# Patient Record
Sex: Female | Born: 1990 | Race: Black or African American | Hispanic: No | Marital: Single | Smoking: Former smoker
Health system: Southern US, Community
[De-identification: ages and names within clinical notes are randomized; demographics above are authoritative.]

---

## 2019-05-01 ENCOUNTER — Encounter (HOSPITAL_COMMUNITY): Payer: Self-pay | Admitting: Emergency Medicine

## 2019-05-01 ENCOUNTER — Emergency Department (HOSPITAL_COMMUNITY): Payer: Self-pay

## 2019-05-01 ENCOUNTER — Other Ambulatory Visit: Payer: Self-pay

## 2019-05-01 ENCOUNTER — Emergency Department (HOSPITAL_COMMUNITY)
Admission: EM | Admit: 2019-05-01 | Discharge: 2019-05-01 | Disposition: A | Discharge status: Home / Self Care | Payer: Self-pay | Attending: Emergency Medicine | Admitting: Emergency Medicine

## 2019-05-01 DIAGNOSIS — M940 Chondrocostal junction syndrome [Tietze]: Secondary | ICD-10-CM | POA: Insufficient documentation

## 2019-05-01 DIAGNOSIS — F17210 Nicotine dependence, cigarettes, uncomplicated: Secondary | ICD-10-CM | POA: Insufficient documentation

## 2019-05-01 LAB — BASIC METABOLIC PANEL
Anion gap: 8 (ref 5–15)
BUN: 19 mg/dL (ref 6–20)
CO2: 23 mmol/L (ref 22–32)
Calcium: 8.9 mg/dL (ref 8.9–10.3)
Chloride: 105 mmol/L (ref 98–111)
Creatinine, Ser: 0.97 mg/dL (ref 0.44–1.00)
GFR calc Af Amer: >60 mL/min (ref >60)
GFR calc non Af Amer: >60 mL/min (ref >60)
Glucose, Bld: 89 mg/dL (ref 70–99)
Potassium: 4.7 mmol/L (ref 3.5–5.1)
Sodium: 136 mmol/L (ref 135–145)

## 2019-05-01 LAB — CBC
HCT: 40 % (ref 36.0–46.0)
Hemoglobin: 13.3 g/dL (ref 12.0–15.0)
MCH: 32.4 pg (ref 26.0–34.0)
MCHC: 33.3 g/dL (ref 30.0–36.0)
MCV: 97.3 fL (ref 80.0–100.0)
Platelets: 210 10*3/uL (ref 150–400)
RBC: 4.11 MIL/uL (ref 3.87–5.11)
RDW: 11.7 % (ref 11.5–15.5)
WBC: 4.4 10*3/uL (ref 4.0–10.5)
nRBC: 0 % (ref 0.0–0.2)

## 2019-05-01 LAB — I-STAT BETA HCG BLOOD, ED (MC, WL, AP ONLY): I-stat hCG, quantitative: <5 m[IU]/mL (ref <5)

## 2019-05-01 LAB — TROPONIN I (HIGH SENSITIVITY)
Troponin I (High Sensitivity): <2 ng/L (ref <18)
Troponin I (High Sensitivity): 2 ng/L (ref <18)

## 2019-05-01 MED ORDER — KETOROLAC TROMETHAMINE 60 MG/2ML IM SOLN
30.0000 mg | Freq: Once | INTRAMUSCULAR | Status: AC
  Administered 2019-05-01: 30 mg via INTRAMUSCULAR
  Filled 2019-05-01: qty 2

## 2019-05-01 MED ORDER — SODIUM CHLORIDE 0.9% FLUSH: 3.0000 mL | Freq: Once | INTRAVENOUS | Status: DC

## 2019-05-01 MED ORDER — IBUPROFEN 800 MG PO TABS: 800.0000 mg | ORAL_TABLET | Freq: Three times a day (TID) | ORAL | 0 refills | Status: DC

## 2019-05-01 MED ORDER — IBUPROFEN 800 MG PO TABS: 800.0000 mg | ORAL_TABLET | Freq: Once | ORAL | Status: DC

## 2019-05-01 NOTE — ED Triage Notes (Signed)
Pt states she started having cp about an hour ago. Denies n/v//d. Pt states the pain went into her left arm and it felt a little tingly. CP worsens with deep inspiration.

## 2019-05-01 NOTE — Discharge Instructions (Addendum)
Take Motrin 3 times a day as prescribed.  You should take this with food, discontinue Motrin if you develop abdominal pain.  Recheck with your primary care provider next week, return to ER for new or worsening symptoms.

## 2019-05-01 NOTE — ED Provider Notes (Signed)
[facility] Provider [facility]   CSN: [id number] Arrival date & time: 05/01/19  1511     History   Chief Complaint Chief Complaint  Patient presents with  . Chest Pain    HPI Wendy Olsen is a 28 y.o. female.     27yo female presents with complaint of pain in her left chest, onset today while driving home from work at [employer]. Pain is intermittent, worse with movement, palpation, taking a deep breath. Patient has not taken anything for her pain. Denies fevers, chills, nausea, vomiting, sweats, injuries. Reports getting over a respiratory cold recently. No other complaints or concerns.      History reviewed. No pertinent past medical history.  There are no active problems to display for this patient.   History reviewed. No pertinent surgical history.   OB History   No obstetric history on file.      Home Medications    Prior to Admission medications   Medication Sig Start Date End Date Taking? Authorizing Provider  ibuprofen (ADVIL) 800 MG tablet Take 1 tablet (800 mg total) by mouth 3 (three) times daily. 05/01/19   Jeannie FendMurphy, Ramia Sidney A, PA-C    Family History History reviewed. No pertinent family history.  Social History Social History   Tobacco Use  . Smoking status: Current Every Day Smoker    Types: Cigarettes  . Smokeless tobacco: Never Used  Substance Use Topics  . Alcohol use: Yes  . Drug use: Not on file     Allergies   Patient has no known allergies.   Review of Systems Review of Systems  Constitutional: Negative for chills, diaphoresis and fever.  Respiratory: Negative for shortness of breath.   Cardiovascular: Positive for chest pain.  Gastrointestinal: Negative for abdominal pain, nausea and vomiting.  Musculoskeletal: Negative for arthralgias and myalgias.  Skin: Negative for rash and wound.  Allergic/Immunologic: Negative for immunocompromised state.  Neurological: Negative for weakness.   Psychiatric/Behavioral: Negative for confusion.  All other systems reviewed and are negative.    Physical Exam Updated Vital Signs BP 125/71 (BP Location: Right Arm)   Pulse 69   Temp 98.6 F (37 C) (Oral)   Resp 16   Ht 5\' 6"  (1.676 m)   Wt 72.6 kg   LMP 04/15/2019   SpO2 100%   BMI 25.82 kg/m   Physical Exam Vitals signs and nursing note reviewed.  Constitutional:      General: She is not in acute distress.    Appearance: She is well-developed. She is not diaphoretic.  HENT:     Head: Normocephalic and atraumatic.  Cardiovascular:     Rate and Rhythm: Normal rate and regular rhythm.     Heart sounds: Normal heart sounds. No murmur.  Pulmonary:     Effort: Pulmonary effort is normal.     Breath sounds: Normal breath sounds. No decreased breath sounds.  Chest:     Chest wall: Tenderness present.    Skin:    General: Skin is warm and dry.  Neurological:     Mental Status: She is alert and oriented to person, place, and time.  Psychiatric:        Behavior: Behavior normal.      ED Treatments / Results  Labs (all labs ordered are listed, but only abnormal results are displayed) Labs Reviewed  BASIC METABOLIC PANEL  CBC  I-STAT BETA HCG BLOOD, ED ([facility], [facility], [facility] ONLY)  TROPONIN I (HIGH SENSITIVITY)  TROPONIN I (HIGH SENSITIVITY)  EKG None  Radiology Dg Chest 2 View  Result Date: 05/01/2019 CLINICAL DATA:  Chest pain. EXAM: CHEST - 2 VIEW COMPARISON:  None. FINDINGS: The heart, hila, and mediastinum are normal. No pneumothorax. No nodules or masses. No focal infiltrates. IMPRESSION: No active cardiopulmonary disease. Electronically Signed   By: Dorise Bullion III M.D   On: 05/01/2019 16:12    Procedures Procedures (including critical care time)  Medications Ordered in ED Medications  sodium chloride flush (NS) 0.9 % injection 3 mL (has no administration in time range)  ketorolac (TORADOL) injection 30 mg (30 mg Intramuscular Given 05/01/19 2002)      Initial Impression / Assessment and Plan / ED Course  I have reviewed the triage vital signs and the nursing notes.  Pertinent labs & imaging results that were available during my care of the patient were reviewed by me and considered in my medical decision making (see chart for details).  Clinical Course as of Apr 30 2058  Thu Sep 17, 39102  [id number] 28 year old female presents with complaint of chest pain onset around 2:00 today when she was driving home from work.  Pain is worse with palpation, movement, taking a deep breath.  Patient is PERC negative.  On exam has tenderness along her lower sternum and left lower ribs anteriorly.  Palpation really reproduces patient's pain.  With history of recent URI suspect costochondritis.  Patient recommended to take Motrin 3 times daily x7 days.  Patient given injection of Toradol while in the ER.  EKG shows no acute ischemic changes, lab work reassuring including negative troponin, normal BMP, CMP, negative hCG.  Patient to follow-up with PCP if symptoms persist, return to ER for new or worsening symptoms.   [LM]    Clinical Course User Index [LM] Tacy Learn, PA-C      Final Clinical Impressions(s) / ED Diagnoses   Final diagnoses:  Costochondritis    ED Discharge Orders         Ordered    ibuprofen (ADVIL) 800 MG tablet  3 times daily     05/01/19 1904           Roque Lias 05/01/19 2059    Maudie Flakes, MD 05/03/19 [phone]

## 2019-05-01 NOTE — ED Notes (Signed)
Patient Alert and oriented to baseline. Stable and ambulatory to baseline. Patient verbalized understanding of the discharge instructions.  Patient belongings were taken by the patient.   

## 2019-08-13 ENCOUNTER — Other Ambulatory Visit: Payer: Self-pay

## 2019-08-13 ENCOUNTER — Encounter (HOSPITAL_COMMUNITY): Payer: Self-pay | Admitting: Emergency Medicine

## 2019-08-13 ENCOUNTER — Emergency Department (HOSPITAL_COMMUNITY)
Admission: EM | Admit: 2019-08-13 | Discharge: 2019-08-14 | Disposition: A | Discharge status: Home / Self Care | Payer: Self-pay | Attending: Emergency Medicine | Admitting: Emergency Medicine

## 2019-08-13 DIAGNOSIS — R109 Unspecified abdominal pain: Secondary | ICD-10-CM | POA: Insufficient documentation

## 2019-08-13 DIAGNOSIS — K625 Hemorrhage of anus and rectum: Secondary | ICD-10-CM | POA: Insufficient documentation

## 2019-08-13 DIAGNOSIS — R110 Nausea: Secondary | ICD-10-CM | POA: Insufficient documentation

## 2019-08-13 DIAGNOSIS — H1131 Conjunctival hemorrhage, right eye: Secondary | ICD-10-CM | POA: Insufficient documentation

## 2019-08-13 DIAGNOSIS — F17210 Nicotine dependence, cigarettes, uncomplicated: Secondary | ICD-10-CM | POA: Insufficient documentation

## 2019-08-13 LAB — URINALYSIS, ROUTINE W REFLEX MICROSCOPIC
Bilirubin Urine: NEGATIVE
Glucose, UA: NEGATIVE mg/dL
Hgb urine dipstick: NEGATIVE
Ketones, ur: NEGATIVE mg/dL
Leukocytes,Ua: NEGATIVE
Nitrite: NEGATIVE
Protein, ur: NEGATIVE mg/dL
Specific Gravity, Urine: 1.015 (ref 1.005–1.030)
pH: 7 (ref 5.0–8.0)

## 2019-08-13 LAB — COMPREHENSIVE METABOLIC PANEL
ALT: 14 U/L (ref 0–44)
AST: 20 U/L (ref 15–41)
Albumin: 3.7 g/dL (ref 3.5–5.0)
Alkaline Phosphatase: 50 U/L (ref 38–126)
Anion gap: 7 (ref 5–15)
BUN: 9 mg/dL (ref 6–20)
CO2: 25 mmol/L (ref 22–32)
Calcium: 8.9 mg/dL (ref 8.9–10.3)
Chloride: 107 mmol/L (ref 98–111)
Creatinine, Ser: 1.13 mg/dL — ABNORMAL HIGH (ref 0.44–1.00)
GFR calc Af Amer: >60 mL/min (ref >60)
GFR calc non Af Amer: >60 mL/min (ref >60)
Glucose, Bld: 86 mg/dL (ref 70–99)
Potassium: 4.1 mmol/L (ref 3.5–5.1)
Sodium: 139 mmol/L (ref 135–145)
Total Bilirubin: 0.3 mg/dL (ref 0.3–1.2)
Total Protein: 6.1 g/dL — ABNORMAL LOW (ref 6.5–8.1)

## 2019-08-13 LAB — CBC
HCT: 40.1 % (ref 36.0–46.0)
Hemoglobin: 12.7 g/dL (ref 12.0–15.0)
MCH: 30.8 pg (ref 26.0–34.0)
MCHC: 31.7 g/dL (ref 30.0–36.0)
MCV: 97.3 fL (ref 80.0–100.0)
Platelets: 241 10*3/uL (ref 150–400)
RBC: 4.12 MIL/uL (ref 3.87–5.11)
RDW: 11.9 % (ref 11.5–15.5)
WBC: 3.7 10*3/uL — ABNORMAL LOW (ref 4.0–10.5)
nRBC: 0 % (ref 0.0–0.2)

## 2019-08-13 LAB — I-STAT BETA HCG BLOOD, ED (MC, WL, AP ONLY): I-stat hCG, quantitative: <5 m[IU]/mL (ref <5)

## 2019-08-13 LAB — LIPASE, BLOOD: Lipase: 21 U/L (ref 11–51)

## 2019-08-13 MED ORDER — SODIUM CHLORIDE 0.9% FLUSH: 3.0000 mL | Freq: Once | INTRAVENOUS | Status: DC

## 2019-08-13 NOTE — ED Triage Notes (Signed)
Patient reports RLQ pain with bloody stool x1 today , mild nausea , no emesis or diarrhea , denies fever or chills .

## 2019-08-14 LAB — POC OCCULT BLOOD, ED: Fecal Occult Bld: NEGATIVE

## 2019-08-14 MED ORDER — IBUPROFEN 400 MG PO TABS
600.0000 mg | ORAL_TABLET | Freq: Once | ORAL | Status: AC
  Administered 2019-08-14: 600 mg via ORAL
  Filled 2019-08-14: qty 1

## 2019-08-14 NOTE — ED Provider Notes (Signed)
[facility]   CSN: [id number] Arrival date & time: 08/13/19  2138     History Chief Complaint  Patient presents with  . Abdominal Pain    Blood In Stool    Wendy Olsen is a 28 y.o. female with no significant past medical history who presents to the emergency department with a chief complaint of rectal bleeding.  The patient reports that she has been having some intermittent, diffuse abdominal cramping, onset today.  She reports that she has felt multiple times that she may need to have a bowel movement, but did not pass any stool.  She reports that earlier today she was finally able to have a bowel movement and noted bright red blood on the outside of the stool and lining the toilet bowl.  No history of similar.  Denies melena.  Reports that she will typically have a bowel movement almost every day or every other day.  She has no history of chronic constipation and has not had any straining to have a bowel movement.  She denies fever, chills, nausea, vomiting, rectal pain, dysuria, hematuria, vaginal pain, bleeding, or discharge, fatigue, shortness of breath, dizziness, or lightheadedness.  She reports that her menstrual cycle ended several days ago.  She works at [employer].  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  She also notes that she developed a red area on her right eye yesterday.  She cannot recall any specific instances where she had to lift or strain to pick up a package over the last few days at work.  She reports that the area has grown significantly smaller since onset.  No eye pain or visual changes.  She denies any other spontaneous bleeding.  No family history of bleeding disorders.  She does not take any blood thinners.  No treatment for symptoms prior to arrival.  No history of abdominal surgery.  She is a current, every day smoker.  She reports social alcohol use and denies other  illicit or recreational drug use.  No known history of hemorrhoids.  The history is provided by the patient. No language interpreter was used.       History reviewed. No pertinent past medical history.  There are no problems to display for this patient.   History reviewed. No pertinent surgical history.   OB History   No obstetric history on file.     No family history on file.  Social History   Tobacco Use  . Smoking status: Current Every Day Smoker    Types: Cigarettes  . Smokeless tobacco: Never Used  Substance Use Topics  . Alcohol use: Yes  . Drug use: Not on file    Home Medications Prior to Admission medications   Medication Sig Start Date End Date Taking? Authorizing Provider  ibuprofen (ADVIL) 800 MG tablet Take 1 tablet (800 mg total) by mouth 3 (three) times daily. 05/01/19   Jeannie FendMurphy, Laura A, PA-C    Allergies    Patient has no known allergies.  Review of Systems   Review of Systems  Constitutional: Negative for activity change, chills and fever.  HENT: Negative for congestion and sore throat.   Respiratory: Negative for shortness of breath.   Cardiovascular: Negative for chest pain.  Gastrointestinal: Positive for abdominal pain. Negative for anal bleeding, constipation, diarrhea, nausea, rectal pain and vomiting.  Genitourinary: Negative for dysuria, flank pain, frequency, pelvic pain, vaginal bleeding, vaginal discharge and vaginal pain.  Musculoskeletal: Negative for back pain, neck pain and neck stiffness.  Skin: Negative for rash.  Allergic/Immunologic: Negative for immunocompromised state.  Neurological: Negative for dizziness, seizures, syncope, weakness and headaches.  Psychiatric/Behavioral: Negative for confusion.    Physical Exam Updated Vital Signs BP 133/79 (BP Location: Left Arm)   Pulse (!) 58   Temp 98.2 F (36.8 C) (Oral)   Resp 16   SpO2 100%   Physical Exam Vitals and nursing note reviewed. Exam conducted with a chaperone  present.  Constitutional:      General: She is not in acute distress.    Appearance: She is normal weight. She is not ill-appearing, toxic-appearing or diaphoretic.     Comments: Well-appearing.  No acute distress.  HENT:     Head: Normocephalic.  Eyes:     Conjunctiva/sclera: Conjunctivae normal.     Comments: Subconjunctival hemorrhage to the right eye at approximately 9 o'clock.   Cardiovascular:     Rate and Rhythm: Normal rate and regular rhythm.     Heart sounds: No murmur. No friction rub. No gallop.   Pulmonary:     Effort: Pulmonary effort is normal. No respiratory distress.     Breath sounds: No stridor. No wheezing, rhonchi or rales.  Chest:     Chest wall: No tenderness.  Abdominal:     General: There is no distension.     Palpations: Abdomen is soft. There is no mass.     Tenderness: There is no abdominal tenderness. There is no right CVA tenderness, left CVA tenderness, guarding or rebound.     Hernia: No hernia is present.     Comments: Abdomen is soft, nontender, nondistended.  Normoactive bowel sounds.   Genitourinary:    Rectum: Normal. Guaiac result negative.     Comments: Chaperoned exam.  Rectal tone is normal.  No external hemorrhoids, fissures, or tears are noted.  Soft brown stool is noted on digital rectal exam.  No active bleeding. Musculoskeletal:     Cervical back: Neck supple.  Skin:    General: Skin is warm.     Findings: No rash.     Comments: No rashes, including petechiae or purpura.  No obvious bruising.  Neurological:     Mental Status: She is alert.  Psychiatric:        Behavior: Behavior normal.     ED Results / Procedures / Treatments   Labs (all labs ordered are listed, but only abnormal results are displayed) Labs Reviewed  COMPREHENSIVE METABOLIC PANEL - Abnormal; Notable for the following components:      Result Value   Creatinine, Ser 1.13 (*)    Total Protein 6.1 (*)    All other components within normal limits  CBC -  Abnormal; Notable for the following components:   WBC 3.7 (*)    All other components within normal limits  URINALYSIS, ROUTINE W REFLEX MICROSCOPIC - Abnormal; Notable for the following components:   APPearance HAZY (*)    All other components within normal limits  LIPASE, BLOOD  I-STAT BETA HCG BLOOD, ED ([facility], [facility], [facility] ONLY)  POC OCCULT BLOOD, ED    EKG None  Radiology No results found.  Procedures Procedures (including critical care time)  Medications Ordered in ED Medications  sodium chloride flush (NS) 0.9 % injection 3 mL (has no administration in time range)  ibuprofen (ADVIL) tablet 600 mg (600 mg Oral Given 08/14/19 4970)    ED Course  I have reviewed the triage vital signs  and the nursing notes.  Pertinent labs & imaging results that were available during my care of the patient were reviewed by me and considered in my medical decision making (see chart for details).    MDM Rules/Calculators/A&P                      28 year old female with no significant past medical history presenting with 1 episode of bright red blood per rectum earlier today.  She has also had some intermittent abdominal cramping.  No constitutional symptoms.  Vital signs are normal in the ER.  She is borderline bradycardic, but I suspect this is her baseline as she is very fit and active.  On exam, she does have a subconjunctival hemorrhage to the right eye.  She has had no other episodes of spontaneous bleeding aside from the one episode of rectal bleeding earlier today.  Labs are overall reassuring.  Hemoglobin is normal at 12.7, stable from September.  Urinalysis is unremarkable.  Rectal exam was reassuring and Hemoccult was negative.  She was given ibuprofen for abdominal cramping with improvement in her symptoms.  I suspect that given her job that she may have strained or sustained a small tear given that she also has a subconjunctival hemorrhage on the right.  No family history of  coagulopathies.  Overall, she is very well-appearing.  She reports that her medical insurance starts on January 1 and she is planning to get established with a primary care provider.  I have a low suspicion for hemorrhagic colitis, GI bleed at this time.  She is hemodynamically stable while no acute distress.  Safe for discharge home with outpatient follow-up as needed.  Final Clinical Impression(s) / ED Diagnoses Final diagnoses:  Rectal bleeding  Abdominal cramping  Subconjunctival hemorrhage of right eye    Rx / DC Orders ED Discharge Orders    None       Barkley Boards, PA-C 08/14/19 1914    Shon Baton, MD 08/14/19 [id number]

## 2019-08-14 NOTE — ED Notes (Signed)
C/o abd. Cramping tonight states she went to the bathroom and she noticed bright red blood after having a bowel movement then she got a little nauseated.

## 2019-08-14 NOTE — Discharge Instructions (Signed)
Thank you for allowing me to care for you today in the Emergency Department.   You were seen today for blood in your stool and abdominal cramping.  Your stool was tested today in the ER and tested negative for blood.  Your labs were all normal and reassuring.  You were also found to have a small subconjunctival hemorrhage, which is a medical term meaning a broken blood vessel, in your right eye.  The broken blood vessel in your eyes should heal on its own in the next few days.  Since your medical insurance starts on January 1, you can use the number on your paperwork to get established with a primary care provider for 2021.  If you have intermittent episodes of bright red blood in your stool you can follow-up with primary care.  Take 650 mg of Tylenol or 600 mg of ibuprofen with food every 6 hours for abdominal cramping.  You can alternate between these 2 medications every 3 hours if your pain returns.  For instance, you can take Tylenol at noon, followed by a dose of ibuprofen at 3, followed by second dose of Tylenol and 6.  You should return to the ER if you develop frequent episodes of black or bloody stools or vomiting, particularly if they are accompanied by fever, severe abdominal pain, shortness of breath, feeling dizzy or lightheaded, or if you pass out.

## 2020-04-27 ENCOUNTER — Encounter (HOSPITAL_COMMUNITY): Payer: Self-pay | Admitting: *Deleted

## 2020-04-27 ENCOUNTER — Emergency Department (HOSPITAL_COMMUNITY)
Admission: EM | Admit: 2020-04-27 | Discharge: 2020-04-27 | Disposition: A | Discharge status: Home / Self Care | Payer: Managed Care, Other (non HMO) | Attending: Emergency Medicine | Admitting: Emergency Medicine

## 2020-04-27 ENCOUNTER — Other Ambulatory Visit: Payer: Self-pay

## 2020-04-27 DIAGNOSIS — U071 COVID-19: Secondary | ICD-10-CM | POA: Insufficient documentation

## 2020-04-27 DIAGNOSIS — F17210 Nicotine dependence, cigarettes, uncomplicated: Secondary | ICD-10-CM | POA: Diagnosis not present

## 2020-04-27 DIAGNOSIS — R509 Fever, unspecified: Secondary | ICD-10-CM | POA: Diagnosis present

## 2020-04-27 DIAGNOSIS — E860 Dehydration: Secondary | ICD-10-CM

## 2020-04-27 DIAGNOSIS — R112 Nausea with vomiting, unspecified: Secondary | ICD-10-CM | POA: Diagnosis not present

## 2020-04-27 LAB — HEPATIC FUNCTION PANEL
ALT: 17 U/L (ref 0–44)
AST: 24 U/L (ref 15–41)
Albumin: 3.7 g/dL (ref 3.5–5.0)
Alkaline Phosphatase: 49 U/L (ref 38–126)
Bilirubin, Direct: 0.1 mg/dL (ref 0.0–0.2)
Indirect Bilirubin: 0.4 mg/dL (ref 0.3–0.9)
Total Bilirubin: 0.5 mg/dL (ref 0.3–1.2)
Total Protein: 6.6 g/dL (ref 6.5–8.1)

## 2020-04-27 LAB — CBC
HCT: 43.7 % (ref 36.0–46.0)
Hemoglobin: 13.7 g/dL (ref 12.0–15.0)
MCH: 30.2 pg (ref 26.0–34.0)
MCHC: 31.4 g/dL (ref 30.0–36.0)
MCV: 96.5 fL (ref 80.0–100.0)
Platelets: 152 10*3/uL (ref 150–400)
RBC: 4.53 MIL/uL (ref 3.87–5.11)
RDW: 12 % (ref 11.5–15.5)
WBC: 2.1 10*3/uL — ABNORMAL LOW (ref 4.0–10.5)
nRBC: 0 % (ref 0.0–0.2)

## 2020-04-27 LAB — BASIC METABOLIC PANEL
Anion gap: 9 (ref 5–15)
BUN: 8 mg/dL (ref 6–20)
CO2: 22 mmol/L (ref 22–32)
Calcium: 9 mg/dL (ref 8.9–10.3)
Chloride: 103 mmol/L (ref 98–111)
Creatinine, Ser: 1.2 mg/dL — ABNORMAL HIGH (ref 0.44–1.00)
GFR calc Af Amer: >60 mL/min (ref >60)
GFR calc non Af Amer: >60 mL/min (ref >60)
Glucose, Bld: 105 mg/dL — ABNORMAL HIGH (ref 70–99)
Potassium: 3.4 mmol/L — ABNORMAL LOW (ref 3.5–5.1)
Sodium: 134 mmol/L — ABNORMAL LOW (ref 135–145)

## 2020-04-27 LAB — I-STAT BETA HCG BLOOD, ED (MC, WL, AP ONLY): I-stat hCG, quantitative: <5 m[IU]/mL (ref <5)

## 2020-04-27 LAB — LIPASE, BLOOD: Lipase: 34 U/L (ref 11–51)

## 2020-04-27 LAB — SARS CORONAVIRUS 2 BY RT PCR (HOSPITAL ORDER, PERFORMED IN ~~LOC~~ HOSPITAL LAB): SARS Coronavirus 2: POSITIVE — AB

## 2020-04-27 MED ORDER — ONDANSETRON 4 MG PO TBDP: 4.0000 mg | ORAL_TABLET | Freq: Three times a day (TID) | ORAL | 0 refills | Status: DC | PRN

## 2020-04-27 MED ORDER — SODIUM CHLORIDE 0.9 % IV BOLUS
1000.0000 mL | Freq: Once | INTRAVENOUS | Status: AC
  Administered 2020-04-27: 1000 mL via INTRAVENOUS

## 2020-04-27 MED ORDER — SODIUM CHLORIDE 0.9 % IV SOLN
1200.0000 mg | Freq: Once | INTRAVENOUS | Status: AC
  Administered 2020-04-27: 1200 mg via INTRAVENOUS
  Filled 2020-04-27: qty 10

## 2020-04-27 MED ORDER — DEXAMETHASONE SODIUM PHOSPHATE 10 MG/ML IJ SOLN
10.0000 mg | Freq: Once | INTRAMUSCULAR | Status: AC
  Administered 2020-04-27: 10 mg via INTRAVENOUS
  Filled 2020-04-27: qty 1

## 2020-04-27 MED ORDER — SODIUM CHLORIDE 0.9 % IV SOLN: INTRAVENOUS | Status: DC | PRN

## 2020-04-27 MED ORDER — FAMOTIDINE IN NACL 20-0.9 MG/50ML-% IV SOLN: 20.0000 mg | Freq: Once | INTRAVENOUS | Status: DC | PRN

## 2020-04-27 MED ORDER — METHYLPREDNISOLONE SODIUM SUCC 125 MG IJ SOLR: 125.0000 mg | Freq: Once | INTRAMUSCULAR | Status: DC | PRN

## 2020-04-27 MED ORDER — EPINEPHRINE 0.3 MG/0.3ML IJ SOAJ: 0.3000 mg | Freq: Once | INTRAMUSCULAR | Status: DC | PRN

## 2020-04-27 MED ORDER — DIPHENHYDRAMINE HCL 50 MG/ML IJ SOLN: 50.0000 mg | Freq: Once | INTRAMUSCULAR | Status: DC | PRN

## 2020-04-27 MED ORDER — NAPROXEN 375 MG PO TABS: 375.0000 mg | ORAL_TABLET | Freq: Two times a day (BID) | ORAL | 0 refills | Status: DC

## 2020-04-27 MED ORDER — ALBUTEROL SULFATE HFA 108 (90 BASE) MCG/ACT IN AERS: 2.0000 | INHALATION_SPRAY | Freq: Once | RESPIRATORY_TRACT | Status: DC | PRN

## 2020-04-27 NOTE — ED Provider Notes (Signed)
[facility] EMERGENCY DEPARTMENT Provider Note   CSN: [id number] Arrival date & time: 04/27/20  0606     History Chief Complaint  Patient presents with  . Weakness  . Fever    Wendy Olsen is a 29 y.o. female who presents emergency department with a chief complaint of flulike symptoms.  Patient had onset of nausea vomiting and diarrhea starting Friday night.  She has associated fatigue, malaise, productive cough, headaches, and myalgias.  Patient states that she feels better when she is lying flat but feels like she is going to pass out and gets very nauseated whenever she stands up.  She has had 4-5 episodes of nonbloody nonbilious vomitus and brown, watery diarrhea daily.  She is not been able to eat states that she has only been able to sip minimal fluids but frequently vomits those up.  She never had anything like this before.  She is not vaccinated against the coronavirus.  She states that she works at [employer] with possible she was exposed to [employer] but she has not had any known sick contacts.  HPI     History reviewed. No pertinent past medical history.  There are no problems to display for this patient.   History reviewed. No pertinent surgical history.   OB History   No obstetric history on file.     No family history on file.  Social History   Tobacco Use  . Smoking status: Current Every Day Smoker    Types: Cigarettes  . Smokeless tobacco: Never Used  Substance Use Topics  . Alcohol use: Yes  . Drug use: Not on file    Home Medications Prior to Admission medications   Medication Sig Start Date End Date Taking? Authorizing Provider  ibuprofen (ADVIL) 800 MG tablet Take 1 tablet (800 mg total) by mouth 3 (three) times daily. 05/01/19   Jeannie Fend, PA-C    Allergies    Patient has no known allergies.  Review of Systems   Review of Systems Ten systems reviewed and are negative for acute change, except as noted in the HPI.   Physical Exam Updated Vital Signs BP (!) 87/50 (BP Location: Left Arm)   Pulse 64   Temp 98.9 F (37.2 C) (Oral)   Resp 18   SpO2 100%   Physical Exam Vitals and nursing note reviewed.  Constitutional:      General: She is not in acute distress.    Appearance: She is well-developed. She is not diaphoretic.  HENT:     Head: Normocephalic and atraumatic.  Eyes:     General: No scleral icterus.    Extraocular Movements: Extraocular movements intact.     Conjunctiva/sclera: Conjunctivae normal.     Pupils: Pupils are equal, round, and reactive to light.  Cardiovascular:     Rate and Rhythm: Normal rate and regular rhythm.     Heart sounds: Normal heart sounds. No murmur heard.  No friction rub. No gallop.   Pulmonary:     Effort: Pulmonary effort is normal. No respiratory distress.     Breath sounds: Normal breath sounds.  Abdominal:     General: Bowel sounds are normal. There is no distension.     Palpations: Abdomen is soft. There is no mass.     Tenderness: There is no abdominal tenderness. There is no guarding.  Musculoskeletal:     Cervical back: Normal range of motion.  Skin:    General: Skin is warm and dry.  Neurological:     Mental Status: She is alert and oriented to person, place, and time.  Psychiatric:        Behavior: Behavior normal.     ED Results / Procedures / Treatments   Labs (all labs ordered are listed, but only abnormal results are displayed) Labs Reviewed  BASIC METABOLIC PANEL - Abnormal; Notable for the following components:      Result Value   Sodium 134 (*)    Potassium 3.4 (*)    Glucose, Bld 105 (*)    Creatinine, Ser 1.20 (*)    All other components within normal limits  CBC - Abnormal; Notable for the following components:   WBC 2.1 (*)    All other components within normal limits  SARS CORONAVIRUS 2 BY RT PCR (HOSPITAL ORDER, PERFORMED IN [facility])  URINALYSIS, ROUTINE W REFLEX MICROSCOPIC  LIPASE, BLOOD    HEPATIC FUNCTION PANEL  I-STAT BETA HCG BLOOD, ED ([facility], [facility], [facility] ONLY)  CBG MONITORING, ED    EKG EKG Interpretation  Date/Time:  Tuesday April 27 2020 06:46:57 EDT Ventricular Rate:  63 PR Interval:  140 QRS Duration: 82 QT Interval:  382 QTC Calculation: 390 R Axis:   96 Text Interpretation: Normal sinus rhythm Right atrial enlargement Rightward axis Pulmonary disease pattern Abnormal ECG When compared to prior, similar apperanec with more wandering baseline No STEMI Confirmed by Theda Belfast ([id number]) on 04/27/2020 7:42:16 AM   Radiology No results found.  Procedures Procedures (including critical care time)  Medications Ordered in ED Medications  sodium chloride 0.9 % bolus 1,000 mL (has no administration in time range)    ED Course  I have reviewed the triage vital signs and the nursing notes.  Pertinent labs & imaging results that were available during my care of the patient were reviewed by me and considered in my medical decision making (see chart for details).  Clinical Course as of Apr 27 806  Tue Apr 27, 2020  0801 Patient here with flulike symptoms including nausea vomiting and diarrhea.  Triage labs ordered include BMP, CBC, Covid test, urinalysis, CBG and pregnancy.  I have added on lipase and hepatic function panel obviously with current pandemic have concern for potential coronavirus.  She is without any current abdominal pain so I will have low suspicion for intra-abdominal infection, appendicitis, cholecystitis.  Abdomen is nontender.  I reviewed the patient's vital signs which show fairly significant hypotension with a blood pressure of 87/50.  Believe this is reflective of volume loss.  I reviewed the current labs which show mildly low sodium and potassium level, creatinine elevated at 1.20 however not much above the patient's baseline.  I have ordered fluid resuscitation.  She has no active vomiting or nausea at this time.   [AH]    Clinical Course User  Index [AH] Arthor Captain, PA-C   MDM Rules/Calculators/A&P                          CC:flu-like sxs, hypotension, n/v VS:  Vitals:   04/27/20 1600 04/27/20 1630 04/27/20 1700 04/27/20 1712  BP: (!) 132/94 (!) 124/94 132/90   Pulse: (!) 46 (!) 50 (!) 50   Resp: 16 12 16    Temp:    98.7 F (37.1 C)  TempSrc:    Oral  SpO2: 100% 100% 100%     is gathered by patient  and emr. Previous records obtained and reviewed. DDX:The patient's complaint  of vomiting involves an extensive number of diagnostic and treatment options, and is a complaint that carries with it a high risk of complications, morbidity, and potential mortality. Given the large differential diagnosis, medical decision making is of high complexity. The emergent differential diagnosis for vomiting includes, but is not limited to ACS/MI, Boerhaave's, DKA, Intracranial Hemorrhage, Ischemic bowel, Meningitis, Sepsis, Acute radiation syndrome, Acute gastric dilation, Acetaminophen toxicity, Adrenal insufficiency, Appendicitis, Aspirin toxicity, Bowel obstruction/ileus, Carbon monoxide poisoning, Cholecystitis, CNS tumor. Digoxin toxicity, Electrolyte abnormalities, Elevated ICP, Gastric outlet obstruction, Hyperemesis gravidarum, Pancreatitis, Peritonitis, Ruptured viscus, Testicular torsion/ovarian torsion, Theophyline toxicity, Biliary colic, Cannabinoid hyperemesis syndrome, Chemotherapy, Disulfiram effect, Erythromycin, ETOH, Gastritis, Gastroenteritis, Gastroparesis, Hepatitis, Ibuprofen, Ipecac toxicity, Labyrinthitis, Migraine, Motion sickness, Narcotic withdrawal, Thyroid, Pregnancy, Peptic ulcer disease, Renal colic Labs: I ordered reviewed and interpreted labs which include CBC with leukopenia cmp with mild bump in CR  negatie pregnancy test Lipase wnl COVID test is postitive Imaging:  RUE:AVWUJ rhythm at a rate of 63 Consults: WJX:BJYNWGN with Covid 19 and dehydration requiring 2 L of fluid. Patient will  receive MAB infusion. She will be safe for dc after infusion . Sign out given to PA Kamiah.     Amiaya Wixted was evaluated in Emergency Department on 04/27/2020 for the symptoms described in the history of present illness. She was evaluated in the context of the global COVID-19 pandemic, which necessitated consideration that the patient might be at risk for infection with the SARS-CoV-2 virus that causes COVID-19. Institutional protocols and algorithms that pertain to the evaluation of patients at risk for COVID-19 are in a state of rapid change based on information released by regulatory bodies including the CDC and federal and state organizations. These policies and algorithms were followed during the patient's care in the ED.  Final Clinical Impression(s) / ED Diagnoses Final diagnoses:  None    Rx / DC Orders ED Discharge Orders    None       Arthor Captain, PA-C 04/27/20 2101    Tegeler, Canary Brim, MD 04/28/20 1742

## 2020-04-27 NOTE — ED Notes (Signed)
Pt reported feeling progressively more lightheaded the longer she stood.   Findings reported to provider.

## 2020-04-27 NOTE — Discharge Instructions (Addendum)
Person Under Monitoring Name: [facility]  Location: [address] [place] [address]   Infection Prevention Recommendations for Individuals Confirmed to have, or Being Evaluated for, 2019 Novel Coronavirus (COVID-19) Infection Who Receive Care at Home  Individuals who are confirmed to have, or are being evaluated for, COVID-19 should follow the prevention steps below until a healthcare provider or local or state health department says they can return to normal activities.  Stay home except to get medical care You should restrict activities outside your home, except for getting medical care. Do not go to work, school, or public areas, and do not use public transportation or taxis.  Call ahead before visiting your doctor Before your medical appointment, call the healthcare provider and tell them that you have, or are being evaluated for, COVID-19 infection. This will help the healthcare provider's office take steps to keep other people from getting infected. Ask your healthcare provider to call the local or state health department.  Monitor your symptoms Seek prompt medical attention if your illness is worsening (e.g., difficulty breathing). Before going to your medical appointment, call the healthcare provider and tell them that you have, or are being evaluated for, COVID-19 infection. Ask your healthcare provider to call the local or state health department.  Wear a facemask You should wear a facemask that covers your nose and mouth when you are in the same room with other people and when you visit a healthcare provider. People who live with or visit you should also wear a facemask while they are in the same room with you.  Separate yourself from other people in your home As much as possible, you should stay in a different room from other people in your home. Also, you should use a separate bathroom, if available.  Avoid sharing household items You should  not share dishes, drinking glasses, cups, eating utensils, towels, bedding, or other items with other people in your home. After using these items, you should wash them thoroughly with soap and water.  Cover your coughs and sneezes Cover your mouth and nose with a tissue when you cough or sneeze, or you can cough or sneeze into your sleeve. Throw used tissues in a lined trash can, and immediately wash your hands with soap and water for at least 20 seconds or use an alcohol-based hand rub.  Wash your [employer] your hands often and thoroughly with soap and water for at least 20 seconds. You can use an alcohol-based hand sanitizer if soap and water are not available and if your hands are not visibly dirty. Avoid touching your eyes, nose, and mouth with unwashed hands.   Prevention Steps for Caregivers and Household Members of Individuals Confirmed to have, or Being Evaluated for, COVID-19 Infection Being Cared for in the Home  If you live with, or provide care at home for, a person confirmed to have, or being evaluated for, COVID-19 infection please follow these guidelines to prevent infection:  Follow healthcare provider's instructions Make sure that you understand and can help the patient follow any healthcare provider instructions for all care.  Provide for the patient's basic needs You should help the patient with basic needs in the home and provide support for getting groceries, prescriptions, and other personal needs.  Monitor the patient's symptoms If they are getting sicker, call his or her medical provider and tell them that the patient has, or is being evaluated for, COVID-19 infection. This will help the healthcare provider's  office take steps to keep other people from getting infected. Ask the healthcare provider to call the local or state health department.  Limit the number of people who have contact with the patient If possible, have only one caregiver for the  patient. Other household members should stay in another home or place of residence. If this is not possible, they should stay in another room, or be separated from the patient as much as possible. Use a separate bathroom, if available. Restrict visitors who do not have an essential need to be in the home.  Keep older adults, very young children, and other sick people away from the patient Keep older adults, very young children, and those who have compromised immune systems or chronic health conditions away from the patient. This includes people with chronic heart, lung, or kidney conditions, diabetes, and cancer.  Ensure good ventilation Make sure that shared spaces in the home have good air flow, such as from an air conditioner or an opened window, weather permitting.  Wash your hands often Wash your hands often and thoroughly with soap and water for at least 20 seconds. You can use an alcohol based hand sanitizer if soap and water are not available and if your hands are not visibly dirty. Avoid touching your eyes, nose, and mouth with unwashed hands. Use disposable paper towels to dry your hands. If not available, use dedicated cloth towels and replace them when they become wet.  Wear a facemask and gloves Wear a disposable facemask at all times in the room and gloves when you touch or have contact with the patient's blood, body fluids, and/or secretions or excretions, such as sweat, saliva, sputum, nasal mucus, vomit, urine, or feces.  Ensure the mask fits over your nose and mouth tightly, and do not touch it during use. Throw out disposable facemasks and gloves after using them. Do not reuse. Wash your hands immediately after removing your facemask and gloves. If your personal clothing becomes contaminated, carefully remove clothing and launder. Wash your hands after handling contaminated clothing. Place all used disposable facemasks, gloves, and other waste in a lined container before  disposing them with other household waste. Remove gloves and wash your hands immediately after handling these items.  Do not share dishes, glasses, or other household items with the patient Avoid sharing household items. You should not share dishes, drinking glasses, cups, eating utensils, towels, bedding, or other items with a patient who is confirmed to have, or being evaluated for, COVID-19 infection. After the person uses these items, you should wash them thoroughly with soap and water.  Wash laundry thoroughly Immediately remove and wash clothes or bedding that have blood, body fluids, and/or secretions or excretions, such as sweat, saliva, sputum, nasal mucus, vomit, urine, or feces, on them. Wear gloves when handling laundry from the patient. Read and follow directions on labels of laundry or clothing items and detergent. In general, wash and dry with the warmest temperatures recommended on the label.  Clean all areas the individual has used often Clean all touchable surfaces, such as counters, tabletops, doorknobs, bathroom fixtures, toilets, phones, keyboards, tablets, and bedside tables, every day. Also, clean any surfaces that may have blood, body fluids, and/or secretions or excretions on them. Wear gloves when cleaning surfaces the patient has come in contact with. Use a diluted bleach solution (e.g., dilute bleach with 1 part bleach and 10 parts water) or a household disinfectant with a label that says EPA-registered for coronaviruses. To make a  bleach solution at home, add 1 tablespoon of bleach to 1 quart (4 cups) of water. For a larger supply, add  cup of bleach to 1 gallon (16 cups) of water. Read labels of cleaning products and follow recommendations provided on product labels. Labels contain instructions for safe and effective use of the cleaning product including precautions you should take when applying the product, such as wearing gloves or eye protection and making sure you  have good ventilation during use of the product. Remove gloves and wash hands immediately after cleaning.  Monitor yourself for signs and symptoms of illness Caregivers and household members are considered close contacts, should monitor their health, and will be asked to limit movement outside of the home to the extent possible. Follow the monitoring steps for close contacts listed on the symptom monitoring form.   ? If you have additional questions, contact your local health department or call the epidemiologist on call at [phone] (available 24/7). ? This guidance is subject to change. For the most up-to-date guidance from [facility], please refer to their website: YouBlogs.pl

## 2020-04-27 NOTE — ED Triage Notes (Signed)
To ED for eval of weakness since Friday. States she noticed fever and sob on Friday. No OTC meds taken. Minimal po intake due to nausea. Pt is not vaccinated for covid and no known contacts. Alert and oriented. Appears to be breathing without difficulty. Appears weak.

## 2020-04-27 NOTE — ED Notes (Signed)
Patient verbalizes understanding of discharge instructions. Opportunity for questioning and answers were provided. Armband removed by staff, pt discharged from ED.  

## 2020-05-15 ENCOUNTER — Other Ambulatory Visit: Payer: Self-pay

## 2020-05-15 ENCOUNTER — Ambulatory Visit (HOSPITAL_COMMUNITY)
Admission: EM | Admit: 2020-05-15 | Discharge: 2020-05-15 | Disposition: A | Discharge status: Home / Self Care | Payer: Managed Care, Other (non HMO) | Attending: Emergency Medicine | Admitting: Emergency Medicine

## 2020-05-15 ENCOUNTER — Encounter (HOSPITAL_COMMUNITY): Payer: Self-pay

## 2020-05-15 DIAGNOSIS — S61412A Laceration without foreign body of left hand, initial encounter: Secondary | ICD-10-CM

## 2020-05-15 MED ORDER — IBUPROFEN 800 MG PO TABS
800.0000 mg | ORAL_TABLET | Freq: Once | ORAL | Status: AC
  Administered 2020-05-15: 800 mg via ORAL

## 2020-05-15 MED ORDER — IBUPROFEN 800 MG PO TABS
ORAL_TABLET | ORAL | Status: AC
  Filled 2020-05-15: qty 1

## 2020-05-15 MED ORDER — IBUPROFEN 800 MG PO TABS: 800.0000 mg | ORAL_TABLET | Freq: Three times a day (TID) | ORAL | 0 refills | Status: AC

## 2020-05-15 MED ORDER — CEPHALEXIN 500 MG PO CAPS: 500.0000 mg | ORAL_CAPSULE | Freq: Four times a day (QID) | ORAL | 0 refills | Status: AC

## 2020-05-15 NOTE — Discharge Instructions (Addendum)
Keep wound clean and dry, wash with warm soapy water twice daily Steri-Strips will fall off on their own over the next 5 to 7 days Ibuprofen or Tylenol for pain and swelling may ice to further help with swelling Pain Keflex over the next 5 days to help prevent infection Ensure tetanus up-to-date with your primary care Follow-up for any concerns about wound healing

## 2020-05-15 NOTE — ED Triage Notes (Signed)
Pt present left hand laceration, last night she grabbed a knife the wrong way and cut her hand.

## 2020-05-15 NOTE — ED Provider Notes (Signed)
[facility]    CSN: [id number] Arrival date & time: 05/15/20  1419      History   Chief Complaint Chief Complaint  Patient presents with   Laceration    left hand    HPI Wendy Olsen is a 29 y.o. female presenting today for evaluation of left hand laceration.  Grabbed a knife last night and accidentally cut her hand.  Incident happened last night around 9/10:00.  She has had some slight decreased range of motion, but declines any lack of movement in any of her fingers.  Believes tetanus up-to-date within the past 5 years, she plans to contact her PCP about this.  HPI  History reviewed. No pertinent past medical history.  There are no problems to display for this patient.   History reviewed. No pertinent surgical history.  OB History   No obstetric history on file.      Home Medications    Prior to Admission medications   Medication Sig Start Date End Date Taking? Authorizing Provider  cephALEXin (KEFLEX) 500 MG capsule Take 1 capsule (500 mg total) by mouth 4 (four) times daily for 5 days. 05/15/20 05/20/20  Brentlee Delage C, PA-C  ibuprofen (ADVIL) 800 MG tablet Take 1 tablet (800 mg total) by mouth 3 (three) times daily. 05/15/20   Zenda Herskowitz, Junius Creamer, PA-C    Family History History reviewed. No pertinent family history.  Social History Social History   Tobacco Use   Smoking status: Current Every Day Smoker    Types: Cigarettes   Smokeless tobacco: Never Used  Substance Use Topics   Alcohol use: Yes   Drug use: Not on file     Allergies   Patient has no known allergies.   Review of Systems Review of Systems  Constitutional: Negative for fatigue and fever.  Eyes: Negative for visual disturbance.  Respiratory: Negative for shortness of breath.   Cardiovascular: Negative for chest pain.  Gastrointestinal: Negative for abdominal pain, nausea and vomiting.  Musculoskeletal: Negative for arthralgias and joint swelling.  Skin:  Positive for wound. Negative for color change and rash.  Neurological: Negative for dizziness, weakness, light-headedness and headaches.     Physical Exam Triage Vital Signs ED Triage Vitals  Enc Vitals Group     BP 05/15/20 1524 126/82     Pulse Rate 05/15/20 1524 61     Resp 05/15/20 1524 16     Temp 05/15/20 1524 98.2 F (36.8 C)     Temp Source 05/15/20 1524 Oral     SpO2 05/15/20 1524 100 %     Weight --      Height --      Head Circumference --      Peak Flow --      Pain Score 05/15/20 1525 0     Pain Loc --      Pain Edu? --      Excl. in GC? --    No data found.  Updated Vital Signs BP 126/82 (BP Location: Left Arm)    Pulse 61    Temp 98.2 F (36.8 C) (Oral)    Resp 16    SpO2 100%   Visual Acuity Right Eye Distance:   Left Eye Distance:   Bilateral Distance:    Right Eye Near:   Left Eye Near:    Bilateral Near:     Physical Exam Vitals and nursing note reviewed.  Constitutional:      Appearance: She is well-developed.  Comments: No acute distress  HENT:     Head: Normocephalic and atraumatic.     Nose: Nose normal.  Eyes:     Conjunctiva/sclera: Conjunctivae normal.  Cardiovascular:     Rate and Rhythm: Normal rate.  Pulmonary:     Effort: Pulmonary effort is normal. No respiratory distress.  Abdominal:     General: There is no distension.  Musculoskeletal:        General: Normal range of motion.     Cervical back: Neck supple.     Comments: Left hand with 1 cm laceration noted to left palm just below second finger, overall well reapproximated, edges approximately 2 mm separated, not actively bleeding  Slightly limited ROM at 2nd MCP joint Radial pulse 2 +  Skin:    General: Skin is warm and dry.  Neurological:     Mental Status: She is alert and oriented to person, place, and time.      UC Treatments / Results  Labs (all labs ordered are listed, but only abnormal results are displayed) Labs Reviewed - No data to  display  EKG   Radiology No results found.  Procedures Procedures (including critical care time)  Medications Ordered in UC Medications  ibuprofen (ADVIL) tablet 800 mg (has no administration in time range)    Initial Impression / Assessment and Plan / UC Course  I have reviewed the triage vital signs and the nursing notes.  Pertinent labs & imaging results that were available during my care of the patient were reviewed by me and considered in my medical decision making (see chart for details).     Wound > 12 hours after incident, overall well reapproximated, wound cleansed with Shur-Clens and irrigated with saline, Steri-Strips applied nonadherent dressing.  Discussed wound care keeping clean and dry.  Tetanus up-to-date, will confirm with PCP.  Anti-inflammatories for pain and swelling.  Suspect localized inflammation, low suspicion of underlying tendon tear or fracture. Keflex for prophylaxis.   Discussed strict return precautions. Patient verbalized understanding and is agreeable with plan.  Final Clinical Impressions(s) / UC Diagnoses   Final diagnoses:  Laceration of left hand without foreign body, initial encounter     Discharge Instructions     Keep wound clean and dry, wash with warm soapy water twice daily Steri-Strips will fall off on their own over the next 5 to 7 days Ibuprofen or Tylenol for pain and swelling may ice to further help with swelling Pain Keflex over the next 5 days to help prevent infection Ensure tetanus up-to-date with your primary care Follow-up for any concerns about wound healing    ED Prescriptions    Medication Sig Dispense Auth. Provider   ibuprofen (ADVIL) 800 MG tablet Take 1 tablet (800 mg total) by mouth 3 (three) times daily. 21 tablet Liborio Saccente C, PA-C   cephALEXin (KEFLEX) 500 MG capsule Take 1 capsule (500 mg total) by mouth 4 (four) times daily for 5 days. 20 capsule Kathline Banbury, Artois C, PA-C     PDMP not reviewed  this encounter.   Lew Dawes, PA-C 05/15/20 1550

## 2020-06-11 ENCOUNTER — Ambulatory Visit: Payer: Managed Care, Other (non HMO) | Admitting: Physician Assistant

## 2020-09-07 IMAGING — DX DG CHEST 2V
2 series · 2 of 2 positions shown · non-contrast
Comparison: None.

CLINICAL DATA: Chest pain.

EXAM:
CHEST - 2 VIEW

[w chest pa]
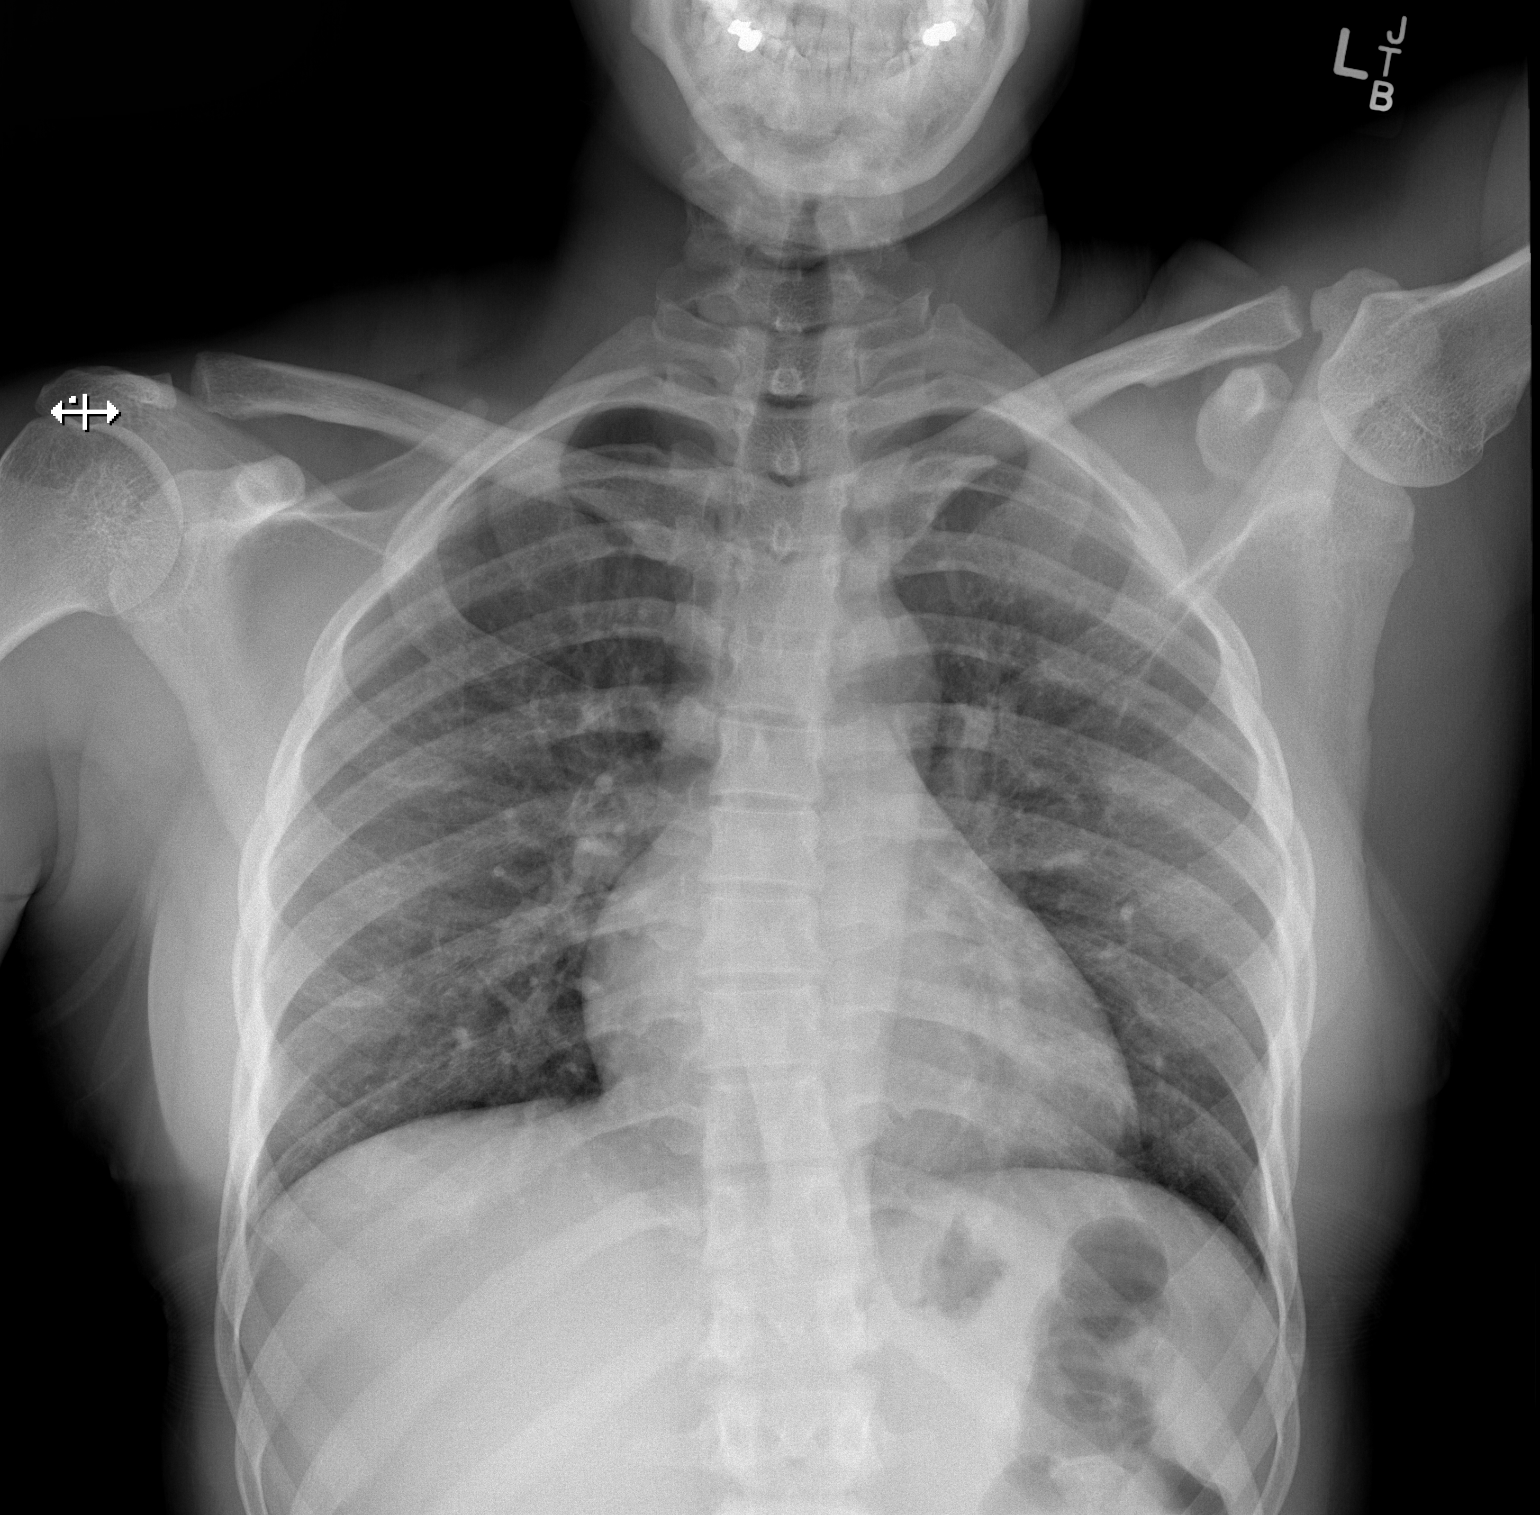

[w chest lat]
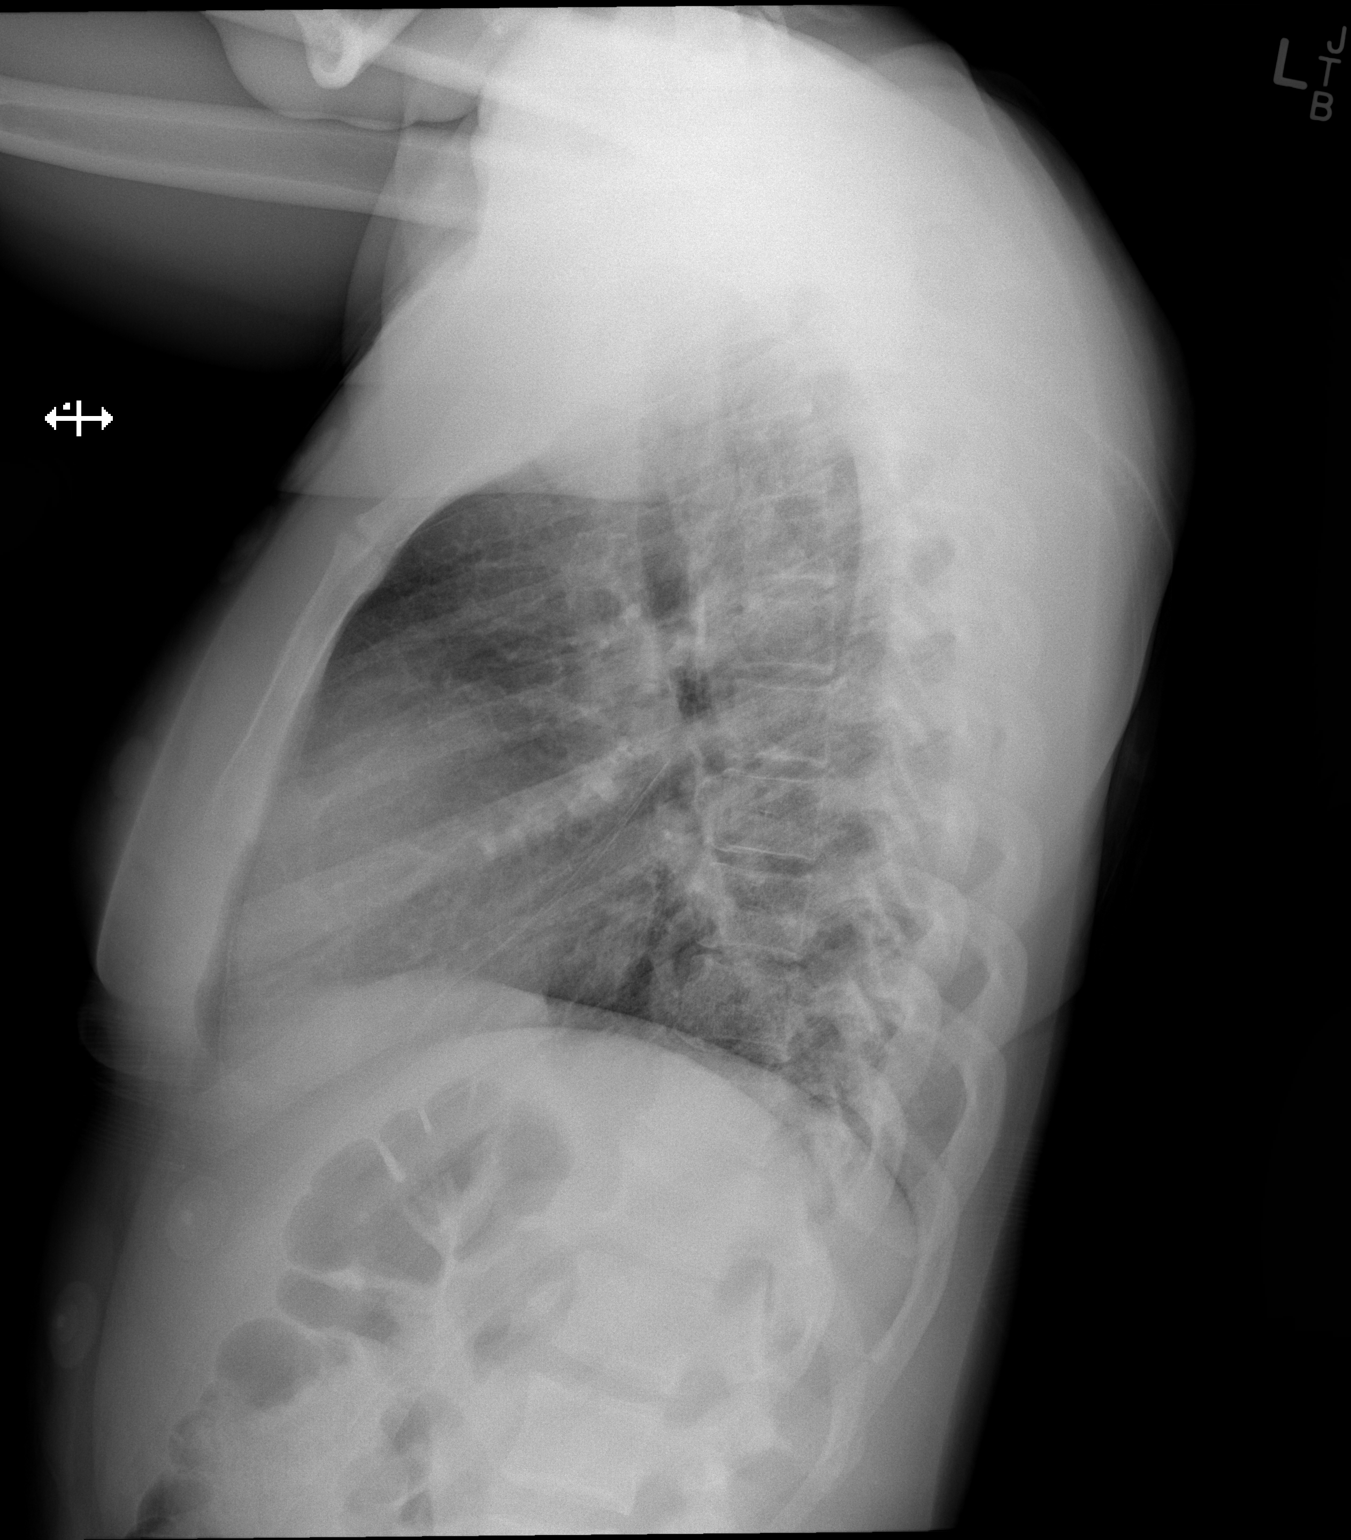

[2 of 2 positions shown; findings below may reference images not displayed]

FINDINGS: The heart, hila, and mediastinum are normal. No pneumothorax. No
nodules or masses. No focal infiltrates.
IMPRESSION: No active cardiopulmonary disease.

## 2024-05-03 ENCOUNTER — Ambulatory Visit
Admission: EM | Admit: 2024-05-03 | Discharge: 2024-05-03 | Disposition: A | Discharge status: Home / Self Care | Payer: Self-pay | Attending: Emergency Medicine | Admitting: Emergency Medicine

## 2024-05-03 ENCOUNTER — Other Ambulatory Visit: Payer: Self-pay

## 2024-05-03 ENCOUNTER — Emergency Department (HOSPITAL_COMMUNITY)
Admission: EM | Admit: 2024-05-03 | Discharge: 2024-05-03 | Disposition: A | Discharge status: Home / Self Care | Payer: Self-pay | Attending: Emergency Medicine | Admitting: Emergency Medicine

## 2024-05-03 ENCOUNTER — Emergency Department (HOSPITAL_COMMUNITY): Payer: Self-pay

## 2024-05-03 ENCOUNTER — Encounter: Payer: Self-pay | Admitting: *Deleted

## 2024-05-03 ENCOUNTER — Ambulatory Visit (INDEPENDENT_AMBULATORY_CARE_PROVIDER_SITE_OTHER): Payer: Self-pay

## 2024-05-03 ENCOUNTER — Encounter (HOSPITAL_COMMUNITY): Payer: Self-pay

## 2024-05-03 DIAGNOSIS — S01312A Laceration without foreign body of left ear, initial encounter: Secondary | ICD-10-CM | POA: Insufficient documentation

## 2024-05-03 DIAGNOSIS — S63502A Unspecified sprain of left wrist, initial encounter: Secondary | ICD-10-CM

## 2024-05-03 DIAGNOSIS — W19XXXA Unspecified fall, initial encounter: Secondary | ICD-10-CM

## 2024-05-03 DIAGNOSIS — S61214A Laceration without foreign body of right ring finger without damage to nail, initial encounter: Secondary | ICD-10-CM | POA: Insufficient documentation

## 2024-05-03 DIAGNOSIS — R519 Headache, unspecified: Secondary | ICD-10-CM

## 2024-05-03 DIAGNOSIS — S0990XA Unspecified injury of head, initial encounter: Secondary | ICD-10-CM

## 2024-05-03 DIAGNOSIS — M25532 Pain in left wrist: Secondary | ICD-10-CM | POA: Insufficient documentation

## 2024-05-03 DIAGNOSIS — S61310A Laceration without foreign body of right index finger with damage to nail, initial encounter: Secondary | ICD-10-CM

## 2024-05-03 DIAGNOSIS — W108XXA Fall (on) (from) other stairs and steps, initial encounter: Secondary | ICD-10-CM | POA: Insufficient documentation

## 2024-05-03 DIAGNOSIS — S61210A Laceration without foreign body of right index finger without damage to nail, initial encounter: Secondary | ICD-10-CM

## 2024-05-03 MED ORDER — LIDOCAINE HCL (PF) 1 % IJ SOLN
30.0000 mL | Freq: Once | INTRAMUSCULAR | Status: AC
  Administered 2024-05-03: 30 mL
  Filled 2024-05-03: qty 30

## 2024-05-03 MED ORDER — OXYCODONE-ACETAMINOPHEN 5-325 MG PO TABS: 1.0000 | ORAL_TABLET | Freq: Four times a day (QID) | ORAL | 0 refills | Status: AC | PRN

## 2024-05-03 MED ORDER — CEPHALEXIN 500 MG PO CAPS: 500.0000 mg | ORAL_CAPSULE | Freq: Two times a day (BID) | ORAL | 0 refills | Status: AC

## 2024-05-03 MED ORDER — TETANUS-DIPHTH-ACELL PERTUSSIS 5-2.5-18.5 LF-MCG/0.5 IM SUSY
0.5000 mL | PREFILLED_SYRINGE | Freq: Once | INTRAMUSCULAR | Status: AC
  Administered 2024-05-03: 0.5 mL via INTRAMUSCULAR

## 2024-05-03 MED ORDER — ACETAMINOPHEN 500 MG PO TABS
1000.0000 mg | ORAL_TABLET | Freq: Once | ORAL | Status: AC
  Administered 2024-05-03: 1000 mg via ORAL
  Filled 2024-05-03: qty 2

## 2024-05-03 MED ORDER — OXYCODONE HCL 5 MG PO TABS
5.0000 mg | ORAL_TABLET | Freq: Once | ORAL | Status: AC
  Administered 2024-05-03: 5 mg via ORAL
  Filled 2024-05-03: qty 1

## 2024-05-03 NOTE — ED Triage Notes (Signed)
 Pt came in via POV d/t walking down stairs this morning & when fell cut the cartilage of Lt ear open, bleeding controlled, also with the contact of Lt FA to the stair caused pain in the FA right above Lt wrist & a lac beside the nail bed of Rt index finger. A/Ox4, rates pain 8/10 during triage. Endorse HA as well.

## 2024-05-03 NOTE — ED Triage Notes (Signed)
 Pt reports she fell down approx 4-5 stairs carrying laundry at there apartment. States she hit her head on metal.. C/o pain in left wrist (swelling noted). Also has lac to top of left ear and right index finger tip. Denies known LOC. States I laid there for a minute then my girlfriend's son came out and helped me up. No meds taken. Left hand/wrist is swollen and she has difficulty making a fist.

## 2024-05-03 NOTE — Discharge Instructions (Signed)
 You have 4 sutures to your left ear, 3 sutures to your right finger  Take the antibiotics as prescribed  I have written you for a short course of pain medicine called Percocet.  Please use caution as medication may make you sleepy.  Do not take if you go back to work  Make sure to follow-up with the hand surgeon, [facility] for your right finger laceration as well as your left wrist pain  Make sure to also follow-up with Penne Croak the ear nose and throat doctor with a laceration to your left ear  Return for any new or worsening symptoms

## 2024-05-03 NOTE — Progress Notes (Signed)
 Orthopedic Tech Progress Note Patient Details:  Wendy Olsen  06/06/1991 [id number]  Ortho Devices Type of Ortho Device: Velcro wrist splint Ortho Device/Splint Location: lue Ortho Device/Splint Interventions: Ordered, Application, Adjustment   Post Interventions Patient Tolerated: Well Instructions Provided: Care of device, Adjustment of device  Chandra Dorn PARAS 05/03/2024, 11:31 PM

## 2024-05-03 NOTE — ED Notes (Signed)
 Patient is being discharged from the Urgent Care and sent to the Emergency Department via pov . Per Asberry Searle, PA-C, patient is in need of higher level of care due to head injury ear laceration. Patient is aware and verbalizes understanding of plan of care.  Vitals:   05/03/24 1509  BP: (!) 129/91  Pulse: 62  Resp: 16  Temp: 98.9 F (37.2 C)  SpO2: 98%

## 2024-05-03 NOTE — ED Provider Notes (Signed)
 [place] [facility] DEPARTMENT AT [facility] Provider Note   CSN: [id number] Arrival date & time: 05/03/24  1659    Patient presents with: Fall, Ear lac, and Finger Lac   Wendy Olsen  is a 33 y.o. female here for evaluation of fall and laceration.  Tripped and fell going down 4-5 steps while carrying a laundry basket.  Hit the left ear and side of her head on metal that was hanging on the wall.  She was seen by urgent care and was given a tetanus shot and sent to the ED.  Incident occurred around 10 AM this morning.  She admits to headache.  Denies LOC, anticoagulation.  She also has pain to her left distal forearm with skin tear as well as to right index finger where she has laceration.  No nailbed damage.  She is right-hand dominant.  No midline C/T/L tenderness.  Nontender chest or abdomen. Ambulatory after fall   HPI     Prior to Admission medications   Medication Sig Start Date End Date Taking? Authorizing Provider  cephALEXin  (KEFLEX ) 500 MG capsule Take 1 capsule (500 mg total) by mouth 2 (two) times daily for 7 days. 05/03/24 05/10/24 Yes Bejamin Hackbart A, PA-C  oxyCODONE -acetaminophen  (PERCOCET/ROXICET) 5-325 MG tablet Take 1 tablet by mouth every 6 (six) hours as needed for severe pain (pain score 7-10). 05/03/24  Yes Rolen Conger A, PA-C  ibuprofen  (ADVIL ) 800 MG tablet Take 1 tablet (800 mg total) by mouth 3 (three) times daily. Patient not taking: Reported on 05/03/2024 05/15/20   Wieters, Hallie C, PA-C    Allergies: Patient has no known allergies.    Review of Systems  Constitutional: Negative.   HENT: Negative.    Respiratory: Negative.    Cardiovascular: Negative.   Gastrointestinal: Negative.   Genitourinary: Negative.   Musculoskeletal: Negative.   Skin:  Positive for wound.  Neurological: Negative.   All other systems reviewed and are negative.   Updated Vital Signs BP (!) 131/94   Pulse 62   Temp 98.3 F (36.8 C) (Oral)   Resp 16    Ht 5' 6 (1.676 m)   Wt 83.9 kg   LMP 04/12/2024 (Approximate)   SpO2 100%   BMI 29.86 kg/m   Physical Exam Vitals and nursing note reviewed.  Constitutional:      General: She is not in acute distress.    Appearance: She is well-developed. She is not ill-appearing, toxic-appearing or diaphoretic.  HENT:     Head: Normocephalic.     Comments: No raccoon eyes, Battle sign.    Ears:     Comments: Left TM 1 cm gaping laceration proximal helix, exposed cartilage. Approx 5mm laceration to left antihelix however difficult to assess due to patient pain and location no active bleeding.    Mouth/Throat:     Lips: Pink.     Pharynx: Oropharynx is clear. Uvula midline.     Comments: No drooling, dysphagia or trismus Eyes:     Pupils: Pupils are equal, round, and reactive to light.     Comments: No raccoon eyes, Battle sign  Cardiovascular:     Rate and Rhythm: Normal rate.     Pulses: Normal pulses.          Radial pulses are 2+ on the right side and 2+ on the left side.       Dorsalis pedis pulses are 2+ on the right side and 2+ on the left side.  Pulmonary:  Effort: No respiratory distress.  Abdominal:     General: There is no distension.  Musculoskeletal:        General: Normal range of motion.     Cervical back: Normal range of motion.     Comments: Diffuse tenderness left distal forearm and wrist.  Nontender hand, humerus.  Able to flex and extend however with pain to left wrist.  Tenderness distal right index finger.  Able to flex and extend without difficulty.  No subungual hematoma.  Nontender right proximal hand, wrist, forearm, humerus.  Nontender bilateral lower extremity  Skin:    General: Skin is warm and dry.     Capillary Refill: Capillary refill takes less than 2 seconds.     Comments: 1 cm laceration right index finger 1.5 cm left laceration to helix and cartilage  Neurological:     General: No focal deficit present.     Mental Status: She is alert.     Cranial  Nerves: Cranial nerves 2-12 are intact.     Sensory: Sensation is intact.     Motor: Motor function is intact.     Coordination: Coordination is intact.     Gait: Gait is intact.  Psychiatric:        Mood and Affect: Mood normal.        (all labs ordered are listed, but only abnormal results are displayed) Labs Reviewed - No data to display  EKG: None  Radiology: CT Head Wo Contrast Result Date: 05/03/2024 EXAM: CT HEAD AND FACIAL BONES 05/03/2024 08:33:00 PM TECHNIQUE: CT of the head and facial bones was performed without the administration of intravenous contrast. Multiplanar reformatted images are provided for review. Automated exposure control, iterative reconstruction, and/or weight based adjustment of the mA/kV was utilized to reduce the radiation dose to as low as reasonably achievable. COMPARISON: None available. CLINICAL HISTORY: Facial trauma, blunt. CT Head Wo Contrast; Facial trauma, Blunt; CT Maxillofacial Wo Contrast; Facial trauma, Blunt; FALL. EAR LAC. FINDINGS: CT HEAD BRAIN AND VENTRICLES: No acute intracranial hemorrhage. No mass effect or midline shift. No extra-axial fluid collection. No evidence of acute infarct. No hydrocephalus. SKULL AND SCALP: No acute skull fracture. No scalp hematoma. CT FACIAL BONES FACIAL BONES: No acute facial fracture. No mandibular dislocation. No suspicious bone lesion. ORBITS: No acute traumatic injury. SINUSES AND MASTOIDS: No acute abnormality. SOFT TISSUES: No acute abnormality. IMPRESSION: 1. No acute intracranial abnormality. 2. No acute facial fracture. Electronically signed by: Pinkie Pebbles MD 05/03/2024 08:47 PM EDT RP Workstation: [id number]   CT Maxillofacial Wo Contrast Result Date: 05/03/2024 EXAM: CT HEAD AND FACIAL BONES 05/03/2024 08:33:00 PM TECHNIQUE: CT of the head and facial bones was performed without the administration of intravenous contrast. Multiplanar reformatted images are provided for review. Automated exposure  control, iterative reconstruction, and/or weight based adjustment of the mA/kV was utilized to reduce the radiation dose to as low as reasonably achievable. COMPARISON: None available. CLINICAL HISTORY: Facial trauma, blunt. CT Head Wo Contrast; Facial trauma, Blunt; CT Maxillofacial Wo Contrast; Facial trauma, Blunt; FALL. EAR LAC. FINDINGS: CT HEAD BRAIN AND VENTRICLES: No acute intracranial hemorrhage. No mass effect or midline shift. No extra-axial fluid collection. No evidence of acute infarct. No hydrocephalus. SKULL AND SCALP: No acute skull fracture. No scalp hematoma. CT FACIAL BONES FACIAL BONES: No acute facial fracture. No mandibular dislocation. No suspicious bone lesion. ORBITS: No acute traumatic injury. SINUSES AND MASTOIDS: No acute abnormality. SOFT TISSUES: No acute abnormality. IMPRESSION: 1. No acute intracranial abnormality. 2.  No acute facial fracture. Electronically signed by: Pinkie Pebbles MD 05/03/2024 08:47 PM EDT RP Workstation: [id number]   DG Forearm Left Result Date: 05/03/2024 CLINICAL DATA:  Fall down stairs, injury, pain EXAM: LEFT FOREARM - 2 VIEW COMPARISON:  None Available. FINDINGS: Soft tissue swelling noted along the dorsum of the forearm. No acute bony abnormality. Specifically, no fracture, subluxation, or dislocation. No radiopaque foreign bodies. No joint effusion within the left elbow. IMPRESSION: No acute bony abnormality. Electronically Signed   By: Franky Crease M.D.   On: 05/03/2024 18:48   DG Wrist Complete Left Result Date: 05/03/2024 CLINICAL DATA:  Fall down stairs.  Injury, pain EXAM: LEFT WRIST - COMPLETE 3+ VIEW COMPARISON:  None Available. FINDINGS: There is no evidence of fracture or dislocation. There is no evidence of arthropathy or other focal bone abnormality. Soft tissues are unremarkable. IMPRESSION: Negative. Electronically Signed   By: Franky Crease M.D.   On: 05/03/2024 18:47   DG Finger Index Right Result Date: 05/03/2024 CLINICAL DATA:   Fall down stairs.  Right index finger injury, pain EXAM: RIGHT INDEX FINGER 2+V COMPARISON:  None Available. FINDINGS: Soft tissue laceration noted at the tip of the right index finger. No acute bony abnormality. Specifically, no fracture, subluxation, or dislocation. No radiopaque foreign bodies. IMPRESSION: No fracture or foreign body. Electronically Signed   By: Franky Crease M.D.   On: 05/03/2024 18:47   DG Wrist Complete Left Result Date: 05/03/2024 CLINICAL DATA:  Fall down steps and landed on wrist. EXAM: LEFT WRIST - COMPLETE 3+ VIEW COMPARISON:  None Available. FINDINGS: There is no evidence of acute fracture or dislocation. There is no evidence of arthropathy or other focal bone abnormality. Soft tissue swelling is present about the distal forearm and wrist. IMPRESSION: No acute fracture or dislocation. Electronically Signed   By: Leita Birmingham M.D.   On: 05/03/2024 15:43     .Laceration Repair  Date/Time: 05/03/2024 11:14 PM  Performed by: Edie Rosebud LABOR, PA-C Authorized by: Edie Rosebud LABOR, PA-C   Consent:    Consent obtained:  Verbal   Consent given by:  Patient   Risks, benefits, and alternatives were discussed: yes     Risks discussed:  Infection, pain, poor wound healing, poor cosmetic result, need for additional repair, nerve damage, vascular damage, tendon damage and retained foreign body   Alternatives discussed:  No treatment, delayed treatment, observation and referral Universal protocol:    Procedure explained and questions answered to patient or proxy's satisfaction: yes     Relevant documents present and verified: yes     Test results available: yes     Imaging studies available: yes     Required blood products, implants, devices, and special equipment available: yes     Site/side marked: yes     Immediately prior to procedure, a time out was called: yes     Patient identity confirmed:  Verbally with patient Anesthesia:    Anesthesia method:  None Laceration  details:    Location:  Ear   Ear location:  L ear   Length (cm):  1.5   Depth (mm):  6 Pre-procedure details:    Preparation:  Patient was prepped and draped in usual sterile fashion and imaging obtained to evaluate for foreign bodies Exploration:    Limited defect created (wound extended): no     Hemostasis achieved with:  Direct pressure   Imaging obtained comment:  CT   Wound extent: muscle damage     Wound  extent: no tendon damage     Wound extent comment:  Cartilage involvement   Contaminated: no   Treatment:    Amount of cleaning:  Extensive   Irrigation solution:  Sterile saline   Irrigation method:  Pressure wash   Layers/structures repaired:  Deep dermal/superficial fascia (cartilage) Deep dermal/superficial fascia:    Suture size:  6-0   Suture material:  Chromic gut   Suture technique:  Simple interrupted   Number of sutures:  2 Skin repair:    Repair method:  Sutures   Suture size:  5-0   Suture material:  Prolene   Suture technique:  Simple interrupted   Number of sutures:  4 Approximation:    Approximation:  Close Repair type:    Repair type:  Complex Post-procedure details:    Dressing:  Open (no dressing)   Procedure completion:  Tolerated well, no immediate complications .Laceration Repair  Date/Time: 05/03/2024 11:18 PM  Performed by: Edie Einstein A, PA-C Authorized by: Edie Einstein LABOR, PA-C   Consent:    Consent obtained:  Verbal   Consent given by:  Patient   Risks, benefits, and alternatives were discussed: yes     Risks discussed:  Infection, pain, tendon damage, vascular damage, poor wound healing, poor cosmetic result, need for additional repair, nerve damage and retained foreign body   Alternatives discussed:  No treatment, delayed treatment, observation and referral Universal protocol:    Procedure explained and questions answered to patient or proxy's satisfaction: yes     Relevant documents present and verified: yes     Test  results available: yes     Imaging studies available: yes     Required blood products, implants, devices, and special equipment available: yes     Site/side marked: yes     Immediately prior to procedure, a time out was called: yes     Patient identity confirmed:  Verbally with patient Anesthesia:    Anesthesia method:  Nerve block   Block location:  Digital block   Block anesthetic:  Lidocaine  1% w/o epi   Block technique:  Digital block   Block injection procedure:  Introduced needle, anatomic landmarks palpated and anatomic landmarks identified   Block outcome:  Anesthesia achieved Laceration details:    Location:  Finger   Finger location:  R index finger   Length (cm):  1   Depth (mm):  3 Pre-procedure details:    Preparation:  Patient was prepped and draped in usual sterile fashion and imaging obtained to evaluate for foreign bodies Exploration:    Hemostasis achieved with:  Direct pressure   Imaging obtained: x-ray     Imaging outcome: foreign body not noted     Wound extent: fascia not violated, no foreign body, no signs of injury, no tendon damage, no underlying fracture and no vascular damage     Contaminated: no   Treatment:    Area cleansed with:  Povidone-iodine   Amount of cleaning:  Extensive   Irrigation solution:  Sterile saline Skin repair:    Repair method:  Sutures   Suture size:  5-0   Suture material:  Prolene   Suture technique:  Simple interrupted   Number of sutures:  3 Approximation:    Approximation:  Close Repair type:    Repair type:  Intermediate Post-procedure details:    Dressing:  Non-adherent dressing   Procedure completion:  Tolerated well, no immediate complications .Nerve Block  Date/Time: 05/03/2024 11:19 PM  Performed by: Edie Einstein  A, PA-C Authorized by: Edie Rosebud LABOR, PA-C   Consent:    Consent obtained:  Verbal   Consent given by:  Patient   Risks, benefits, and alternatives were discussed: yes     Risks  discussed:  Allergic reaction, infection, nerve damage, intravenous injection, bleeding, pain, unsuccessful block and swelling   Alternatives discussed:  No treatment, delayed treatment, alternative treatment and referral Universal protocol:    Procedure explained and questions answered to patient or proxy's satisfaction: yes     Relevant documents present and verified: yes     Test results available: yes     Imaging studies available: yes     Required blood products, implants, devices, and special equipment available: yes     Site/side marked: yes     Immediately prior to procedure, a time out was called: yes     Patient identity confirmed:  Verbally with patient Indications:    Indications:  Pain relief Location:    Body area:  Upper extremity   Upper extremity nerve blocked: Digital.   Laterality:  Right Pre-procedure details:    Skin preparation:  Povidone-iodine   Preparation: Patient was prepped and draped in usual sterile fashion   Skin anesthesia:    Skin anesthesia method:  None Procedure details:    Block needle gauge:  18 G   Anesthetic injected:  Lidocaine  1% w/o epi   Steroid injected:  None   Additive injected:  None   Injection procedure:  Anatomic landmarks identified, anatomic landmarks palpated, introduced needle and incremental injection   Paresthesia:  Prolonged Post-procedure details:    Outcome:  Pain relieved   Procedure completion:  Tolerated well, no immediate complications .Ortho Injury Treatment  Date/Time: 05/03/2024 11:20 PM  Performed by: Edie Rosebud LABOR, PA-C Authorized by: Edie Rosebud LABOR, PA-C   Consent:    Consent obtained:  Verbal   Consent given by:  Patient   Risks discussed:  Fracture, nerve damage, restricted joint movement, vascular damage, stiffness, recurrent dislocation and irreducible dislocation   Alternatives discussed:  No treatment, alternative treatment, immobilization, referral and delayed treatmentInjury location:  wrist Location details: left wrist Injury type: soft tissue Pre-procedure neurovascular assessment: neurovascularly intact Pre-procedure distal perfusion: normal Pre-procedure neurological function: normal Pre-procedure range of motion: normal  Anesthesia: Local anesthesia used: no  Patient sedated: NoImmobilization: brace Splint Applied by: Ortho Tech Supplies used: Prefabricated splint. Post-procedure neurovascular assessment: post-procedure neurovascularly intact Post-procedure distal perfusion: normal Post-procedure neurological function: normal Post-procedure range of motion: normal      Medications Ordered in the ED  acetaminophen  (TYLENOL ) tablet 1,000 mg (1,000 mg Oral Given 05/03/24 1952)  lidocaine  (PF) (XYLOCAINE ) 1 % injection 30 mL (30 mLs Infiltration Given by Other 05/03/24 2049)  oxyCODONE  (Oxy IR/ROXICODONE ) immediate release tablet 5 mg (5 mg Oral Given 05/03/24 2131)   32 here for evaluation of fall which occurred around 10 AM.  Has laceration to left helix, antihelix as well as right index finger.  Some diffuse tenderness to left distal forearm and wrist.  No midline C/T/L tenderness.  No LOC or anticoagulation.  Tetanus was updated urgent care.  Imaging personally viewed and interpreted:  X-ray left forearm, left wrist, right index finger without significant abnormality CT imaging without acute abnormality  Discussed with ENT Dr. Penne Croak who personally looked at patient's images.  Recommends closure in the ED, abx, fu outpatient.  Able to approximate patient's left ear X laceration with 4 sutures.  Antihelix superficial, not bleeding, ready scabbed over  will allow healing by secondary intent  Patient had digital block to right index finger, #3 Prolene sutures  With regards to left wrist and arm pain.  Placed in a Velcro splint.  Will have follow-up outpatient with hand surgery.  She will return for new or worsening symptoms.  The patient has been  appropriately medically screened and/or stabilized in the ED. I have low suspicion for any other emergent medical condition which would require further screening, evaluation or treatment in the ED or require inpatient management.  Patient is hemodynamically stable and in no acute distress.  Patient able to ambulate in department prior to ED.  Evaluation does not show acute pathology that would require ongoing or additional emergent interventions while in the emergency department or further inpatient treatment.  I have discussed the diagnosis with the patient and answered all questions.  Pain is been managed while in the emergency department and patient has no further complaints prior to discharge.  Patient is comfortable with plan discussed in room and is stable for discharge at this time.  I have discussed strict return precautions for returning to the emergency department.  Patient was encouraged to follow-up with PCP/specialist refer to at discharge.                                  Medical Decision Making Amount and/or Complexity of Data Reviewed External Data Reviewed: labs, radiology and notes. Radiology: ordered and independent interpretation performed. Decision-making details documented in ED Course.  Risk OTC drugs. Prescription drug management. Diagnosis or treatment significantly limited by social determinants of health.       Final diagnoses:  Fall, initial encounter  Laceration of left earlobe, initial encounter  Left wrist pain  Laceration of right index finger without foreign body without damage to nail, initial encounter    ED Discharge Orders          Ordered    cephALEXin  (KEFLEX ) 500 MG capsule  2 times daily        05/03/24 2244    oxyCODONE -acetaminophen  (PERCOCET/ROXICET) 5-325 MG tablet  Every 6 hours PRN        05/03/24 2244               Erikah Thumm A, PA-C 05/03/24 2326    Tegeler, Lonni PARAS, MD 05/03/24 2345    Vern Prestia A,  PA-C 05/04/24 1640    Tegeler, Lonni PARAS, MD 05/04/24 2313

## 2024-05-03 NOTE — ED Triage Notes (Signed)
 Pt ambulatory to ER with c/o falling down steps this AM around 10AM.  C/o laceration to left ear and left wrist pain.

## 2024-05-03 NOTE — ED Notes (Signed)
 Rt index finger cleaned & recovered with clean drg in triage.

## 2024-05-03 NOTE — Discharge Instructions (Addendum)
 The wrist is not broken. You likely have a bad sprain. Use the ace wrap for support and compression.  We have updated your tetanus vaccine today  Please go to the emergency department for further evaluation of your head injury, headache, and the laceration on your ear.

## 2024-05-03 NOTE — ED Provider Notes (Signed)
 [facility]    CSN: [id number] Arrival date & time: 05/03/24  1355      History   Chief Complaint Chief Complaint  Patient presents with   Fall    HPI Wendy Olsen  is a 33 y.o. female.  Fell down 4-5 stairs while carrying laundry Her head was cut by metal on the wall, and then she hit her head on the ground. She is not sure if she lost consciousness or not, states I don't think so. Tried to lay down but started having bad headache. Feels like a migraine.  Not anticoagulated  She has been able to ambulate since the fall.  Having swelling and pain in the left wrist with limited movement.   Last tetanus maybe 5 years ago  History reviewed. No pertinent past medical history.  There are no active problems to display for this patient.   History reviewed. No pertinent surgical history.  OB History   No obstetric history on file.      Home Medications    Prior to Admission medications   Medication Sig Start Date End Date Taking? Authorizing Provider  ibuprofen  (ADVIL ) 800 MG tablet Take 1 tablet (800 mg total) by mouth 3 (three) times daily. Patient not taking: Reported on 05/03/2024 05/15/20   Reva Rubie BROCKS, PA-C    Family History History reviewed. No pertinent family history.  Social History Social History   Tobacco Use   Smoking status: Former    Types: Cigarettes   Smokeless tobacco: Never  Vaping Use   Vaping status: Every Day  Substance Use Topics   Alcohol use: Yes    Comment: ocassional   Drug use: Never     Allergies   Patient has no known allergies.   Review of Systems Review of Systems  As per HPI  Physical Exam Triage Vital Signs ED Triage Vitals [05/03/24 1507]  Encounter Vitals Group     BP      Girls Systolic BP Percentile      Girls Diastolic BP Percentile      Boys Systolic BP Percentile      Boys Diastolic BP Percentile      Pulse      Resp      Temp      Temp src      SpO2      Weight       Height      Head Circumference      Peak Flow      Pain Score 7     Pain Loc      Pain Education      Exclude from Growth Chart    No data found.  Updated Vital Signs BP (!) 129/91 (BP Location: Left Arm)   Pulse 62   Temp 98.9 F (37.2 C) (Oral)   Resp 16   LMP 04/12/2024 (Approximate)   SpO2 98%    Physical Exam Vitals and nursing note reviewed.  Constitutional:      General: She is not in acute distress. HENT:     Left Ear: Laceration and tenderness present.     Ears:      Comments: Large gaping laceration on the left upper lobe. There is another laceration on external ear but underneath ridges. There is white hard mass here. I am not sure if this is exposed lacerated cartilage. Exam is difficult due to patient pain     Mouth/Throat:     Mouth: Mucous membranes are moist.  Pharynx: Oropharynx is clear.  Eyes:     Conjunctiva/sclera: Conjunctivae normal.     Pupils: Pupils are equal, round, and reactive to light.  Cardiovascular:     Rate and Rhythm: Normal rate and regular rhythm.     Pulses: Normal pulses.     Heart sounds: Normal heart sounds.  Pulmonary:     Effort: Pulmonary effort is normal.     Breath sounds: Normal breath sounds.  Musculoskeletal:     Left wrist: Swelling and tenderness present. Decreased range of motion. Normal pulse.     Cervical back: Normal range of motion.     Comments: Left wrist and distal forearm swollen, tender. Limited ROM of wrist due to pain. Disal sensation intact, cap refill < 2 seconds, radial pulse 2+. Grip strength 3/5. Normal ROM of elbow and shoulder without pain. There is small abrasion on the dorsal forearm, already scabbed over.   Skin:    General: Skin is warm and dry.     Findings: Laceration present.     Comments: Laceration distal right index finger. Damage to nail. The laceration is along lateral nail edge and extends to distal finger tip. Gaping. Not bleeding.  Neurological:     Mental Status: She is alert  and oriented to person, place, and time.     Sensory: No sensory deficit.     Coordination: Coordination normal.     Gait: Gait normal.    UC Treatments / Results  Labs (all labs ordered are listed, but only abnormal results are displayed) Labs Reviewed - No data to display  EKG  Radiology DG Wrist Complete Left Result Date: 05/03/2024 CLINICAL DATA:  Fall down steps and landed on wrist. EXAM: LEFT WRIST - COMPLETE 3+ VIEW COMPARISON:  None Available. FINDINGS: There is no evidence of acute fracture or dislocation. There is no evidence of arthropathy or other focal bone abnormality. Soft tissue swelling is present about the distal forearm and wrist. IMPRESSION: No acute fracture or dislocation. Electronically Signed   By: Leita Birmingham M.D.   On: 05/03/2024 15:43    Procedures Procedures (including critical care time)  Medications Ordered in UC Medications  Tdap (BOOSTRIX) injection 0.5 mL (0.5 mLs Intramuscular Given 05/03/24 1558)    Initial Impression / Assessment and Plan / UC Course  I have reviewed the triage vital signs and the nursing notes.  Pertinent labs & imaging results that were available during my care of the patient were reviewed by me and considered in my medical decision making (see chart for details).  Left wrist xray without broken or dislocated bone. Images independently reviewed by me, agree with radiology interpretation. ACE wrap is applied  Laceration of right index finger and left ear. Tetanus vaccine is updated today. The ear laceration is complicated; through the outer lobe cartilage and possible fossa of helix, and beneath the inferior crus. I am unsure if there is a piece of cartilage that is avulsed from the lobe.  Additionally she fell, hit her head, not sure of LOC status. Having headache. This will need higher level of care in the ED, better pain control and imaging available there, limited resources in urgent care.  Patient friend will transport  her via POV.   Final Clinical Impressions(s) / UC Diagnoses   Final diagnoses:  Sprain of left wrist, initial encounter  Complex laceration of left ear, initial encounter  Laceration of right index finger with damage to nail, foreign body presence unspecified, initial encounter  Acute nonintractable  headache, unspecified headache type  Fall, initial encounter  Injury of head, initial encounter     Discharge Instructions      The wrist is not broken. You likely have a bad sprain. Use the ace wrap for support and compression.  We have updated your tetanus vaccine today  Please go to the emergency department for further evaluation of your head injury, headache, and the laceration on your ear.      ED Prescriptions   None    PDMP not reviewed this encounter.   Leavy Heatherly, [place] RIGGERS 05/03/24 1620
